# Patient Record
Sex: Female | Born: 2000 | Race: Black or African American | Hispanic: No | Marital: Single | Smoking: Never smoker
Health system: Southern US, Community
[De-identification: ages and names within clinical notes are randomized; demographics above are authoritative.]

## PROBLEM LIST (undated history)

## (undated) DIAGNOSIS — Z789 Other specified health status: Secondary | ICD-10-CM

## (undated) HISTORY — PX: NO PAST SURGERIES: SHX2092

---
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
## 2021-01-20 ENCOUNTER — Ambulatory Visit: Payer: Self-pay | Admitting: Family Medicine

## 2021-02-16 ENCOUNTER — Ambulatory Visit: Payer: Medicaid Other | Admitting: Family Medicine

## 2021-03-03 ENCOUNTER — Ambulatory Visit (INDEPENDENT_AMBULATORY_CARE_PROVIDER_SITE_OTHER): Payer: Medicaid Other | Admitting: Family Medicine

## 2021-03-03 ENCOUNTER — Other Ambulatory Visit: Payer: Self-pay

## 2021-03-03 ENCOUNTER — Encounter: Payer: Self-pay | Admitting: Family Medicine

## 2021-03-03 DIAGNOSIS — F411 Generalized anxiety disorder: Secondary | ICD-10-CM

## 2021-03-03 DIAGNOSIS — Z7689 Persons encountering health services in other specified circumstances: Secondary | ICD-10-CM

## 2021-03-04 ENCOUNTER — Telehealth: Payer: Self-pay | Admitting: Family Medicine

## 2021-03-04 NOTE — Telephone Encounter (Signed)
Patient mom called and said the apartment complex did not have a form to fill out concerning emotional support. However, they will take a standard letter from provider.

## 2021-03-04 NOTE — Telephone Encounter (Signed)
Patients mom called in asking if Dr. Andrey Campanile can write a letter stating the patient needs an emotional support animal and fax it Glenard Haring at [phone].

## 2021-03-04 NOTE — Progress Notes (Signed)
   New Patient Office Visit  Subjective:  Patient ID: Ashley Christian, female    DOB: 2001/02/09  Age: 20 y.o. MRN: [id number]  CC:  Chief Complaint  Patient presents with   Establish Care    HPI Ashley Christian presents for to establish care.  Patient is a Consulting civil engineer at [employer] reports that she has been having high anxiety related to her schoolwork as well as her living situation.  She was requesting to be able to have an emotional support animal to live with her.  History reviewed. No pertinent past medical history.  History reviewed. No pertinent surgical history.  Family History  Problem Relation Age of Onset   Graves' disease Mother    Alopecia Mother    Hyperthyroidism Mother    Hypertension Mother    Breast cancer Maternal Aunt    Hypertension Maternal Aunt    Thyroid disease Maternal Aunt    Hypertension Maternal Uncle    Hypertension Maternal Grandmother    Dementia Maternal Grandmother     Social History   Socioeconomic History   Marital status: Single    Spouse name: Not on file   Number of children: Not on file   Years of education: Not on file   Highest education level: Not on file  Occupational History   Not on file  Tobacco Use   Smoking status: Never   Smokeless tobacco: Never  Substance and Sexual Activity   Alcohol use: Not on file   Drug use: Not on file   Sexual activity: Not on file  Other Topics Concern   Not on file  Social History Narrative   Not on file   Social Determinants of Health   Financial Resource Strain: Not on file  Food Insecurity: Not on file  Transportation Needs: Not on file  Physical Activity: Not on file  Stress: Not on file  Social Connections: Not on file  Intimate Partner Violence: Not on file    ROS Review of Systems  Psychiatric/Behavioral:  Negative for self-injury, sleep disturbance and suicidal ideas. The patient is nervous/anxious.   All other systems reviewed and are negative.  Objective:   Today's  Vitals: There were no vitals taken for this visit.  Physical Exam Vitals and nursing note reviewed.  Constitutional:      General: She is not in acute distress. Cardiovascular:     Rate and Rhythm: Normal rate and regular rhythm.  Pulmonary:     Effort: Pulmonary effort is normal.     Breath sounds: Normal breath sounds.  Neurological:     General: No focal deficit present.     Mental Status: She is alert and oriented to person, place, and time.  Psychiatric:        Mood and Affect: Mood is anxious.        Behavior: Behavior normal.    Assessment & Plan:   1. Generalized anxiety disorder Patient defers any medications on today.  She will follow-up to see about counseling at the [facility].  She also will check with her housing for paperwork regarding emotional support animal.  2. Encounter to establish care     Outpatient Encounter Medications as of 03/03/2021  Medication Sig   levocetirizine (XYZAL) 5 MG tablet Take 5 mg by mouth daily.   No facility-administered encounter medications on file as of 03/03/2021.    Follow-up: Return if symptoms worsen or fail to improve.   Tommie Raymond, MD

## 2021-03-07 ENCOUNTER — Encounter: Payer: Self-pay | Admitting: Family Medicine

## 2021-05-27 ENCOUNTER — Encounter: Payer: Self-pay | Admitting: Nurse Practitioner

## 2021-05-27 ENCOUNTER — Telehealth (INDEPENDENT_AMBULATORY_CARE_PROVIDER_SITE_OTHER): Payer: Medicaid Other | Admitting: Nurse Practitioner

## 2021-05-27 ENCOUNTER — Other Ambulatory Visit: Payer: Self-pay

## 2021-05-27 DIAGNOSIS — J029 Acute pharyngitis, unspecified: Secondary | ICD-10-CM

## 2021-05-27 DIAGNOSIS — R6889 Other general symptoms and signs: Secondary | ICD-10-CM | POA: Diagnosis not present

## 2021-05-27 MED ORDER — AMOXICILLIN 875 MG PO TABS: 875.0000 mg | ORAL_TABLET | Freq: Two times a day (BID) | ORAL | 0 refills | Status: AC

## 2021-05-27 NOTE — Patient Instructions (Addendum)
URI:   Stay well hydrated  Stay active  Deep breathing exercises  May take tylenol for fever or pain  Will order amoxicillin  Warm salt water gargles recommended  Will order respiratory panel  Will order strep swab   Follow up:  Follow up if needed

## 2021-05-27 NOTE — Progress Notes (Signed)
Virtual Visit via Telephone Note  I connected with Christinna Petrosino on 05/27/21 at 10:20 AM EST by telephone and verified that I am speaking with the correct person using two identifiers.  Location: Patient: home Provider: office   I discussed the limitations, risks, security and privacy concerns of performing an evaluation and management service by telephone and the availability of in person appointments. I also discussed with the patient that there may be a patient responsible charge related to this service. The patient expressed understanding and agreed to proceed.   History of Present Illness:  Patient presents today for sick visit through telephone visit.  She states that she has been experiencing sore throat, headache, sinus pressure, trouble sleeping for the past 2 to 3 days.  She states that she thinks she had a fever yesterday but did not check her temperature. Denies f/c/s, n/v/d, hemoptysis, PND, chest pain or edema.     Observations/Objective:   Alert and oriented   Assessment and Plan:   URI:   Stay well hydrated  Stay active  Deep breathing exercises  May take tylenol for fever or pain  Will order amoxicillin  Warm salt water gargles recommended  Will order respiratory panel  Will order strep swab   Follow up:  Follow up if needed      I discussed the assessment and treatment plan with the patient. The patient was provided an opportunity to ask questions and all were answered. The patient agreed with the plan and demonstrated an understanding of the instructions.   The patient was advised to call back or seek an in-person evaluation if the symptoms worsen or if the condition fails to improve as anticipated.  I provided 23 minutes of non-face-to-face time during this encounter.   Ivonne Andrew, NP

## 2021-05-28 LAB — COVID-19, FLU A+B AND RSV
Influenza A, NAA: NOT DETECTED
Influenza B, NAA: NOT DETECTED
RSV, NAA: NOT DETECTED
SARS-CoV-2, NAA: NOT DETECTED

## 2021-05-29 ENCOUNTER — Emergency Department (HOSPITAL_COMMUNITY): Payer: Medicaid Other

## 2021-05-29 ENCOUNTER — Other Ambulatory Visit: Payer: Self-pay

## 2021-05-29 ENCOUNTER — Encounter (HOSPITAL_COMMUNITY): Payer: Self-pay

## 2021-05-29 ENCOUNTER — Emergency Department (HOSPITAL_COMMUNITY)
Admission: EM | Admit: 2021-05-29 | Discharge: 2021-05-29 | Disposition: A | Discharge status: Home / Self Care | Payer: Medicaid Other | Attending: Emergency Medicine | Admitting: Emergency Medicine

## 2021-05-29 DIAGNOSIS — J039 Acute tonsillitis, unspecified: Secondary | ICD-10-CM | POA: Insufficient documentation

## 2021-05-29 DIAGNOSIS — J351 Hypertrophy of tonsils: Secondary | ICD-10-CM | POA: Diagnosis not present

## 2021-05-29 DIAGNOSIS — J029 Acute pharyngitis, unspecified: Secondary | ICD-10-CM | POA: Diagnosis not present

## 2021-05-29 DIAGNOSIS — Z20822 Contact with and (suspected) exposure to covid-19: Secondary | ICD-10-CM | POA: Insufficient documentation

## 2021-05-29 DIAGNOSIS — R590 Localized enlarged lymph nodes: Secondary | ICD-10-CM | POA: Diagnosis not present

## 2021-05-29 LAB — RESP PANEL BY RT-PCR (FLU A&B, COVID) ARPGX2
Influenza A by PCR: NEGATIVE
Influenza B by PCR: NEGATIVE
SARS Coronavirus 2 by RT PCR: NEGATIVE

## 2021-05-29 LAB — CBC WITH DIFFERENTIAL/PLATELET
Abs Immature Granulocytes: 0.03 10*3/uL (ref 0.00–0.07)
Basophils Absolute: 0.1 10*3/uL (ref 0.0–0.1)
Basophils Relative: 1 %
Eosinophils Absolute: 0.1 10*3/uL (ref 0.0–0.5)
Eosinophils Relative: 1 %
HCT: 37.9 % (ref 36.0–46.0)
Hemoglobin: 12.9 g/dL (ref 12.0–15.0)
Immature Granulocytes: 0 %
Lymphocytes Relative: 50 %
Lymphs Abs: 3.3 10*3/uL (ref 0.7–4.0)
MCH: 29.4 pg (ref 26.0–34.0)
MCHC: 34 g/dL (ref 30.0–36.0)
MCV: 86.3 fL (ref 80.0–100.0)
Monocytes Absolute: 0.8 10*3/uL (ref 0.1–1.0)
Monocytes Relative: 12 %
Neutro Abs: 2.5 10*3/uL (ref 1.7–7.7)
Neutrophils Relative %: 36 %
Platelets: 173 10*3/uL (ref 150–400)
RBC: 4.39 MIL/uL (ref 3.87–5.11)
RDW: 12.3 % (ref 11.5–15.5)
WBC Morphology: ABNORMAL
WBC: 6.8 10*3/uL (ref 4.0–10.5)
nRBC: 0 % (ref 0.0–0.2)

## 2021-05-29 LAB — BASIC METABOLIC PANEL
Anion gap: 12 (ref 5–15)
BUN: 18 mg/dL (ref 6–20)
CO2: 25 mmol/L (ref 22–32)
Calcium: 9.4 mg/dL (ref 8.9–10.3)
Chloride: 96 mmol/L — ABNORMAL LOW (ref 98–111)
Creatinine, Ser: 0.85 mg/dL (ref 0.44–1.00)
GFR, Estimated: >60 mL/min (ref >60)
Glucose, Bld: 90 mg/dL (ref 70–99)
Potassium: 3.6 mmol/L (ref 3.5–5.1)
Sodium: 133 mmol/L — ABNORMAL LOW (ref 135–145)

## 2021-05-29 LAB — GROUP A STREP BY PCR: Group A Strep by PCR: NOT DETECTED

## 2021-05-29 LAB — I-STAT BETA HCG BLOOD, ED (MC, WL, AP ONLY): I-stat hCG, quantitative: <5 m[IU]/mL (ref <5)

## 2021-05-29 MED ORDER — ACETAMINOPHEN 325 MG PO TABS: 650.0000 mg | ORAL_TABLET | Freq: Once | ORAL | Status: DC | PRN

## 2021-05-29 MED ORDER — HYDROCODONE-ACETAMINOPHEN 7.5-325 MG/15ML PO SOLN
10.0000 mL | Freq: Once | ORAL | Status: AC
  Administered 2021-05-29: 10 mL via ORAL
  Filled 2021-05-29: qty 15

## 2021-05-29 MED ORDER — HYDROCODONE-ACETAMINOPHEN 7.5-325 MG/15ML PO SOLN: 10.0000 mL | ORAL | 0 refills | Status: DC | PRN

## 2021-05-29 MED ORDER — DEXAMETHASONE SODIUM PHOSPHATE 10 MG/ML IJ SOLN
10.0000 mg | Freq: Once | INTRAMUSCULAR | Status: AC
  Administered 2021-05-29: 22:00:00 10 mg via INTRAVENOUS
  Filled 2021-05-29: qty 1

## 2021-05-29 MED ORDER — CLINDAMYCIN PHOSPHATE 600 MG/50ML IV SOLN
600.0000 mg | Freq: Once | INTRAVENOUS | Status: AC
  Administered 2021-05-29: 22:00:00 600 mg via INTRAVENOUS
  Filled 2021-05-29: qty 50

## 2021-05-29 MED ORDER — IOHEXOL 350 MG/ML SOLN
60.0000 mL | Freq: Once | INTRAVENOUS | Status: AC | PRN
  Administered 2021-05-29: 21:00:00 60 mL via INTRAVENOUS

## 2021-05-29 NOTE — Discharge Instructions (Signed)
Follow up with ENT (Dr. Jearld Fenton) for outpatient evaluation of severe tonsillitis. Hycet for pain as needed.   Return to the ED with any new or concerning symptoms at any time.

## 2021-05-29 NOTE — ED Provider Notes (Signed)
[facility]-EMERGENCY DEPT Provider Note   CSN: [id number] Arrival date & time: 05/29/21  1849     History Chief Complaint  Patient presents with   Sore Throat    Ashley Christian is a 20 y.o. female.  Patient to ED for evaluation of severe sore throat that started several days ago. She notes her tonsils are swollen with a white growth bilaterally. She has been on Amoxicillin for the past 4 days and reports symptoms are getting worse. She is able to drink liquids but is drinking less secondary to pain. She does not want to eat. No vomiting or diarrhea. No fever.   The history is provided by the patient. No language interpreter was used.  Sore Throat Pertinent negatives include no shortness of breath.      History reviewed. No pertinent past medical history.  There are no problems to display for this patient.   History reviewed. No pertinent surgical history.   OB History   No obstetric history on file.     Family History  Problem Relation Age of Onset   Graves' disease Mother    Alopecia Mother    Hyperthyroidism Mother    Hypertension Mother    Breast cancer Maternal Aunt    Hypertension Maternal Aunt    Thyroid disease Maternal Aunt    Hypertension Maternal Uncle    Hypertension Maternal Grandmother    Dementia Maternal Grandmother     Social History   Tobacco Use   Smoking status: Never   Smokeless tobacco: Never    Home Medications Prior to Admission medications   Medication Sig Start Date End Date Taking? Authorizing Provider  amoxicillin (AMOXIL) 875 MG tablet Take 1 tablet (875 mg total) by mouth 2 (two) times daily for 10 days. 05/27/21 06/06/21  Ivonne Andrew, NP  levocetirizine (XYZAL) 5 MG tablet Take 5 mg by mouth daily. 03/03/21   [provider]    Allergies    Patient has no known allergies.  Review of Systems   Review of Systems  Constitutional:  Positive for appetite change. Negative for chills and  fever.  HENT:  Positive for sore throat. Negative for congestion.   Respiratory: Negative.  Negative for cough and shortness of breath.   Cardiovascular: Negative.   Gastrointestinal: Negative.  Negative for nausea and vomiting.  Musculoskeletal: Negative.   Skin: Negative.  Negative for rash.  Neurological: Negative.  Negative for weakness.   Physical Exam Updated Vital Signs BP (!) 110/92 (BP Location: Right Arm)    Pulse 82    Temp 98.4 F (36.9 C) (Oral)    Resp 16    Ht 5\' 8"  (1.727 m)    Wt 54.4 kg    LMP 05/29/2021    SpO2 100%    BMI 18.25 kg/m   Physical Exam Vitals and nursing note reviewed.  Constitutional:      Appearance: She is well-developed.  HENT:     Head: Normocephalic.     Mouth/Throat:     Pharynx: Uvula midline. No uvula swelling.     Tonsils: Tonsillar exudate present. 2+ on the right. 2+ on the left.  Cardiovascular:     Rate and Rhythm: Normal rate and regular rhythm.     Heart sounds: No murmur heard. Pulmonary:     Effort: Pulmonary effort is normal.     Breath sounds: Normal breath sounds. No wheezing, rhonchi or rales.  Abdominal:     General: Bowel sounds are normal.  Palpations: Abdomen is soft.     Tenderness: There is no abdominal tenderness. There is no guarding or rebound.  Musculoskeletal:        General: Normal range of motion.     Cervical back: Normal range of motion and neck supple.  Skin:    General: Skin is warm and dry.  Neurological:     General: No focal deficit present.     Mental Status: She is alert and oriented to person, place, and time.    ED Results / Procedures / Treatments   Labs (all labs ordered are listed, but only abnormal results are displayed) Labs Reviewed  BASIC METABOLIC PANEL - Abnormal; Notable for the following components:      Result Value   Sodium 133 (*)    Chloride 96 (*)    All other components within normal limits  GROUP A STREP BY PCR  RESP PANEL BY RT-PCR (FLU A&B, COVID) ARPGX2  CBC  WITH DIFFERENTIAL/PLATELET  I-STAT BETA HCG BLOOD, ED ([facility], [facility], [facility] ONLY)   Results for orders placed or performed during the hospital encounter of 05/29/21  Group A Strep by PCR   Specimen: Throat; Sterile Swab  Result Value Ref Range   Group A Strep by PCR NOT DETECTED NOT DETECTED  Resp Panel by RT-PCR (Flu A&B, Covid) Throat   Specimen: Throat; Nasopharyngeal(NP) swabs in vial transport medium  Result Value Ref Range   SARS Coronavirus 2 by RT PCR NEGATIVE NEGATIVE   Influenza A by PCR NEGATIVE NEGATIVE   Influenza B by PCR NEGATIVE NEGATIVE  CBC with Differential  Result Value Ref Range   WBC 6.8 4.0 - 10.5 K/uL   RBC 4.39 3.87 - 5.11 MIL/uL   Hemoglobin 12.9 12.0 - 15.0 g/dL   HCT 37.9 36.0 - 46.0 %   MCV 86.3 80.0 - 100.0 fL   MCH 29.4 26.0 - 34.0 pg   MCHC 34.0 30.0 - 36.0 g/dL   RDW 12.3 11.5 - 15.5 %   Platelets 173 150 - 400 K/uL   nRBC 0.0 0.0 - 0.2 %   Neutrophils Relative % 36 %   Neutro Abs 2.5 1.7 - 7.7 K/uL   Lymphocytes Relative 50 %   Lymphs Abs 3.3 0.7 - 4.0 K/uL   Monocytes Relative 12 %   Monocytes Absolute 0.8 0.1 - 1.0 K/uL   Eosinophils Relative 1 %   Eosinophils Absolute 0.1 0.0 - 0.5 K/uL   Basophils Relative 1 %   Basophils Absolute 0.1 0.0 - 0.1 K/uL   WBC Morphology Abnormal lymphocytes present    RBC Morphology MORPHOLOGY UNREMARKABLE    Smear Review MORPHOLOGY UNREMARKABLE    Immature Granulocytes 0 %   Abs Immature Granulocytes 0.03 0.00 - 0.07 K/uL  Basic metabolic panel  Result Value Ref Range   Sodium 133 (L) 135 - 145 mmol/L   Potassium 3.6 3.5 - 5.1 mmol/L   Chloride 96 (L) 98 - 111 mmol/L   CO2 25 22 - 32 mmol/L   Glucose, Bld 90 70 - 99 mg/dL   BUN 18 6 - 20 mg/dL   Creatinine, Ser 0.85 0.44 - 1.00 mg/dL   Calcium 9.4 8.9 - 10.3 mg/dL   GFR, Estimated >60 >60 mL/min   Anion gap 12 5 - 15  I-Stat beta hCG blood, ED  Result Value Ref Range   I-stat hCG, quantitative <5.0 <5 mIU/mL   Comment 3  EKG None  Radiology CT Soft Tissue Neck W Contrast  Result Date: 05/29/2021 CLINICAL DATA:  Sore throat since Thursday EXAM: CT NECK WITH CONTRAST TECHNIQUE: Multidetector CT imaging of the neck was performed using the standard protocol following the bolus administration of intravenous contrast. CONTRAST:  32mL OMNIPAQUE IOHEXOL 350 MG/ML SOLN COMPARISON:  None. FINDINGS: Pharynx and larynx: Enlargement and heterogeneous enhancement of the bilateral palatine tonsils, which enhance heterogeneously, and meet in the midline ("kissing tonsils"), concerning for tonsillitis. No definite low-density collection is seen to suggest peritonsillar abscess. The larynx and pharynx are otherwise unremarkable. Salivary glands: No inflammation, mass, or stone. Thyroid: Normal. Lymph nodes: Prominent bilateral level 2A lymph nodes, the largest of which is on the right, measuring up to 1.3 cm. This is favored to be reactive. Vascular: Patent arterial and venous vasculature. Limited intracranial: Negative. Visualized orbits: Negative. Mastoids and visualized paranasal sinuses: Clear. Skeleton: No acute osseous abnormality. Upper chest: Negative. Other: None IMPRESSION: Enlargement and heterogeneous enhancement of the bilateral palatine tonsils, concerning for tonsillitis. No definite low-density collection to suggest peritonsillar abscess. Electronically Signed   By: Merilyn Baba M.D.   On: 05/29/2021 22:19    Procedures Procedures   Medications Ordered in ED Medications  acetaminophen (TYLENOL) tablet 650 mg (has no administration in time range)  clindamycin (CLEOCIN) IVPB 600 mg (600 mg Intravenous New Bag/Given 05/29/21 2202)  dexamethasone (DECADRON) injection 10 mg (10 mg Intravenous Given 05/29/21 2202)  iohexol (OMNIPAQUE) 350 MG/ML injection 60 mL (60 mLs Intravenous Contrast Given 05/29/21 2122)    ED Course  I have reviewed the triage vital signs and the nursing notes.  Pertinent labs & imaging  results that were available during my care of the patient were reviewed by me and considered in my medical decision making (see chart for details).    MDM Rules/Calculators/A&P                          Patient to ED with tonsillitis, no better on Amoxil for 3-4 days, no fever.   Labs ordered during the triage process reviewed and are reassuring. CT soft tissue neck c/w tonsillitis. Decadron given for inflammation and swelling. IVF's provided after which she feels some better. Feel she can be discharged home with ENT referral. Will provide Hycet for comfort care, encouraged continuation of Tylenol and ibuprofen, fluids.   Return precautions discussed.     Final Clinical Impression(s) / ED Diagnoses Final diagnoses:  None   Exudatitive tonsillitis  Rx / DC Orders ED Discharge Orders     None        Charlann Lange, PA-C 05/30/21 [id number]    Drenda Freeze, MD 05/30/21 [id number]

## 2021-05-29 NOTE — ED Provider Notes (Signed)
Emergency Medicine Provider Triage Evaluation Note  Ashley Christian , a 20 y.o. female  was evaluated in triage.  Pt complains of sore throat. Seen at urgent care earlier this week and started on amox, sxs worse.  Review of Systems  Positive: Sore throat Negative: cough  Physical Exam  BP 113/87 (BP Location: Right Arm)    Pulse 93    Temp 98.4 F (36.9 C) (Oral)    Resp 16    Ht 5\' 8"  (1.727 m)    Wt 54.4 kg    SpO2 96%    BMI 18.25 kg/m  Gen:   Awake, no distress   Resp:  Normal effort  MSK:   Moves extremities without difficulty  Other:  Exudates noted bilaterally to the tonsils, tonsils appear to be touching, voice is muffled but pt tolerating secretions  Medical Decision Making  Medically screening exam initiated at 7:52 PM.  Appropriate orders placed.  Ashley Christian was informed that the remainder of the evaluation will be completed by another provider, this initial triage assessment does not replace that evaluation, and the importance of remaining in the ED until their evaluation is complete.    05/29/21 1953    05/31/21, MD 05/29/21 2029

## 2021-05-29 NOTE — ED Triage Notes (Signed)
Pt reports with sore throat since Thursday. Pt has spots and odor coming from her throat.

## 2021-05-30 ENCOUNTER — Telehealth: Payer: Self-pay

## 2021-05-30 LAB — CULTURE, GROUP A STREP: Strep A Culture: NEGATIVE

## 2021-05-30 NOTE — Telephone Encounter (Signed)
Transition Care Management Unsuccessful Follow-up Telephone Call  Date of discharge and from where:  05/29/2021-   Attempts:  1st Attempt  Reason for unsuccessful TCM follow-up call:  Unable to leave message

## 2021-05-31 NOTE — Telephone Encounter (Signed)
Transition Care Management Unsuccessful Follow-up Telephone Call  Date of discharge and from where:  05/29/2021-[facility]   Attempts:  2nd Attempt  Reason for unsuccessful TCM follow-up call:  Unable to leave message

## 2021-06-01 NOTE — Telephone Encounter (Signed)
Transition Care Management Follow-up Telephone Call Date of discharge and from where: 05/29/2021 from [facility] How have you been since you were released from the hospital? Pt stated that she is feeling better and the pain she was experiencing is better.  Any questions or concerns? No  Items Reviewed: Did the pt receive and understand the discharge instructions provided? Yes  Medications obtained and verified? Yes  Other? No  Any new allergies since your discharge? No  Dietary orders reviewed? No Do you have support at home? Yes   Functional Questionnaire: (I = Independent and D = Dependent) ADLs: I  Bathing/Dressing- I  Meal Prep- I  Eating- I  Maintaining continence- I  Transferring/Ambulation- I  Managing Meds- I   Follow up appointments reviewed:  PCP Hospital f/u appt confirmed? No   Specialist Hospital f/u appt confirmed? Yes  Pt stated that mother has scheduled with ENT.  Are transportation arrangements needed? No  If their condition worsens, is the pt aware to call PCP or go to the Emergency Dept.? Yes Was the patient provided with contact information for the PCP's office or ED? Yes Was to pt encouraged to call back with questions or concerns? Yes

## 2021-06-02 DIAGNOSIS — J039 Acute tonsillitis, unspecified: Secondary | ICD-10-CM | POA: Diagnosis not present

## 2021-11-16 DIAGNOSIS — Z01419 Encounter for gynecological examination (general) (routine) without abnormal findings: Secondary | ICD-10-CM | POA: Diagnosis not present

## 2021-11-16 DIAGNOSIS — Z1389 Encounter for screening for other disorder: Secondary | ICD-10-CM | POA: Diagnosis not present

## 2021-11-16 DIAGNOSIS — Z3040 Encounter for surveillance of contraceptives, unspecified: Secondary | ICD-10-CM | POA: Diagnosis not present

## 2021-11-16 DIAGNOSIS — Z30013 Encounter for initial prescription of injectable contraceptive: Secondary | ICD-10-CM | POA: Diagnosis not present

## 2021-11-16 DIAGNOSIS — Z113 Encounter for screening for infections with a predominantly sexual mode of transmission: Secondary | ICD-10-CM | POA: Diagnosis not present

## 2021-11-16 DIAGNOSIS — Z130 Encounter for screening for diseases of the blood and blood-forming organs and certain disorders involving the immune mechanism: Secondary | ICD-10-CM | POA: Diagnosis not present

## 2021-11-22 DIAGNOSIS — A549 Gonococcal infection, unspecified: Secondary | ICD-10-CM | POA: Diagnosis not present

## 2021-12-01 ENCOUNTER — Ambulatory Visit
Admission: EM | Admit: 2021-12-01 | Discharge: 2021-12-01 | Disposition: A | Discharge status: Home / Self Care | Payer: Medicaid Other | Attending: Internal Medicine | Admitting: Internal Medicine

## 2021-12-01 ENCOUNTER — Ambulatory Visit: Payer: Self-pay | Admitting: *Deleted

## 2021-12-01 DIAGNOSIS — R1013 Epigastric pain: Secondary | ICD-10-CM

## 2021-12-01 MED ORDER — OMEPRAZOLE 20 MG PO CPDR
20.0000 mg | DELAYED_RELEASE_CAPSULE | Freq: Every day | ORAL | 0 refills | Status: AC
Start: 2021-12-01 — End: ?

## 2021-12-01 MED ORDER — SUCRALFATE 1 G PO TABS: 1.0000 g | ORAL_TABLET | Freq: Three times a day (TID) | ORAL | 0 refills | Status: DC

## 2021-12-01 NOTE — Telephone Encounter (Signed)
Reason for Disposition  [1] Swallowing difficulty AND [2] cause unknown (Exception: difficulty swallowing is a chronic symptom)  Answer Assessment - Initial Assessment Questions 1. SYMPTOM: "Are you having difficulty swallowing liquids, solids, or both?"     Painful swallowing- feels pain further down- chest/stomach 2. ONSET: "When did the swallowing problems begin?"      1 week 3. CAUSE: "What do you think is causing the problem?"      unsure 4. CHRONIC/RECURRENT: "Is this a new problem for you?"  If no, ask: "How long have you had this problem?" (e.g., days, weeks, months)      New problem- 1 week 5. OTHER SYMPTOMS: "Do you have any other symptoms?" (e.g., difficulty breathing, sore throat, swollen tongue, chest pain)     Pain in chest- almost like swallowing too much at one time 6. PREGNANCY: "Is there any chance you are pregnant?" "When was your last menstrual period?"     No- LMP-last month  Protocols used: Swallowing Difficulty-A-AH

## 2021-12-01 NOTE — ED Provider Notes (Signed)
[facility]    CSN: [id number] Arrival date & time: 12/01/21  1714      History   Chief Complaint Chief Complaint  Patient presents with   Abdominal Pain    HPI Ashley Christian is a 21 y.o. female.   Patient presents with epigastric pain that started a few days prior.  Patient reports that symptoms are exacerbated with eating or drinking.  She describes it as a burning pain that starts in the epigastric area and sometimes radiates up her chest into her neck.  Patient does not take any medications for symptoms.  Denies history of acid reflux.  Denies nausea, vomiting, diarrhea, blood in stool.   Abdominal Pain   History reviewed. No pertinent past medical history.  There are no problems to display for this patient.   History reviewed. No pertinent surgical history.  OB History   No obstetric history on file.      Home Medications    Prior to Admission medications   Medication Sig Start Date End Date Taking? Authorizing Provider  omeprazole (PRILOSEC) 20 MG capsule Take 1 capsule (20 mg total) by mouth daily. 12/01/21  Yes , Acie Fredrickson, FNP  sucralfate (CARAFATE) 1 g tablet Take 1 tablet (1 g total) by mouth 4 (four) times daily -  with meals and at bedtime. 12/01/21  Yes , Acie Fredrickson, FNP  HYDROcodone-acetaminophen (HYCET) 7.5-325 mg/15 ml solution Take 10 mLs by mouth every 4 (four) hours as needed for moderate pain or severe pain. 05/29/21   Elpidio Anis, PA-C  levocetirizine (XYZAL) 5 MG tablet Take 5 mg by mouth daily. 03/03/21   [provider]    Family History Family History  Problem Relation Age of Onset   Graves' disease Mother    Alopecia Mother    Hyperthyroidism Mother    Hypertension Mother    Breast cancer Maternal Aunt    Hypertension Maternal Aunt    Thyroid disease Maternal Aunt    Hypertension Maternal Uncle    Hypertension Maternal Grandmother    Dementia Maternal Grandmother     Social History Social History    Tobacco Use   Smoking status: Never   Smokeless tobacco: Never     Allergies   Patient has no known allergies.   Review of Systems Review of Systems Per HPI  Physical Exam Triage Vital Signs ED Triage Vitals  Enc Vitals Group     BP 12/01/21 1733 112/72     Pulse Rate 12/01/21 1733 83     Resp --      Temp 12/01/21 1733 98.4 F (36.9 C)     Temp Source 12/01/21 1733 Oral     SpO2 12/01/21 1733 95 %     Weight --      Height --      Head Circumference --      Peak Flow --      Pain Score 12/01/21 1739 0     Pain Loc --      Pain Edu? --      Excl. in GC? --    No data found.  Updated Vital Signs BP 112/72 (BP Location: Left Arm)   Pulse 83   Temp 98.4 F (36.9 C) (Oral)   SpO2 95%   Visual Acuity Right Eye Distance:   Left Eye Distance:   Bilateral Distance:    Right Eye Near:   Left Eye Near:    Bilateral Near:     Physical Exam Constitutional:  General: She is not in acute distress.    Appearance: Normal appearance. She is not toxic-appearing or diaphoretic.  HENT:     Head: Normocephalic and atraumatic.     Mouth/Throat:     Mouth: Mucous membranes are moist.     Pharynx: No posterior oropharyngeal erythema.  Eyes:     Extraocular Movements: Extraocular movements intact.     Conjunctiva/sclera: Conjunctivae normal.  Cardiovascular:     Rate and Rhythm: Normal rate and regular rhythm.     Pulses: Normal pulses.     Heart sounds: Normal heart sounds.  Pulmonary:     Effort: Pulmonary effort is normal. No respiratory distress.     Breath sounds: Normal breath sounds.  Abdominal:     General: Abdomen is flat. Bowel sounds are normal.     Palpations: Abdomen is soft.     Tenderness: There is no abdominal tenderness.  Neurological:     General: No focal deficit present.     Mental Status: She is alert and oriented to person, place, and time. Mental status is at baseline.  Psychiatric:        Mood and Affect: Mood normal.         Behavior: Behavior normal.        Thought Content: Thought content normal.        Judgment: Judgment normal.      UC Treatments / Results  Labs (all labs ordered are listed, but only abnormal results are displayed) Labs Reviewed - No data to display  EKG   Radiology No results found.  Procedures Procedures (including critical care time)  Medications Ordered in UC Medications - No data to display  Initial Impression / Assessment and Plan / UC Course  I have reviewed the triage vital signs and the nursing notes.  Pertinent labs & imaging results that were available during my care of the patient were reviewed by me and considered in my medical decision making (see chart for details).     Symptoms and physical exam are consistent with possible GERD.  Will treat with PPI and Carafate.  Due to patient's age and new symptoms, I do think this warrants further evaluation by gastrointestinal specialist.  Patient was provided with contact information.  Patient was advised of supportive care as well.  Discussed return precautions.  Patient verbalized understanding and was agreeable with plan. Final Clinical Impressions(s) / UC Diagnoses   Final diagnoses:  Abdominal pain, epigastric     Discharge Instructions      I am suspicious that you are having reflux which is heartburn.  This is being treated with 2 different medications.  Please follow-up with provided contact information for stomach doctor tomorrow for further evaluation and management.    ED Prescriptions     Medication Sig Dispense Auth. Provider   omeprazole (PRILOSEC) 20 MG capsule Take 1 capsule (20 mg total) by mouth daily. 30 capsule Valle, Tyrone E, [place]   sucralfate (CARAFATE) 1 g tablet Take 1 tablet (1 g total) by mouth 4 (four) times daily -  with meals and at bedtime. 120 tablet Aldrich, Acie Fredrickson, [place]      PDMP not reviewed this encounter.   Gustavus Bryant, [place] 12/01/21 [phone]

## 2021-12-01 NOTE — ED Triage Notes (Signed)
Pt c/o pain in stomach that is burning and stabbing that happens when she eats or drinks something. States it's minor at first and then gets more intense. Onset several days ago.

## 2021-12-01 NOTE — Discharge Instructions (Signed)
I am suspicious that you are having reflux which is heartburn.  This is being treated with 2 different medications.  Please follow-up with provided contact information for stomach doctor tomorrow for further evaluation and management.

## 2021-12-20 ENCOUNTER — Encounter: Payer: Self-pay | Admitting: Family Medicine

## 2021-12-20 ENCOUNTER — Ambulatory Visit (INDEPENDENT_AMBULATORY_CARE_PROVIDER_SITE_OTHER): Payer: Medicaid Other | Admitting: Family Medicine

## 2021-12-20 VITALS — BP 107/69 | HR 98 | Temp 98.1°F | Resp 16 | Wt 127.0 lb

## 2021-12-20 DIAGNOSIS — J302 Other seasonal allergic rhinitis: Secondary | ICD-10-CM | POA: Diagnosis not present

## 2021-12-20 DIAGNOSIS — Z202 Contact with and (suspected) exposure to infections with a predominantly sexual mode of transmission: Secondary | ICD-10-CM | POA: Diagnosis not present

## 2021-12-20 DIAGNOSIS — F411 Generalized anxiety disorder: Secondary | ICD-10-CM

## 2021-12-20 MED ORDER — LEVOCETIRIZINE DIHYDROCHLORIDE 5 MG PO TABS: 5.0000 mg | ORAL_TABLET | Freq: Every day | ORAL | 3 refills | Status: AC

## 2021-12-20 NOTE — Progress Notes (Unsigned)
Patient is here for STI check. Patient said her prior partner was dx with herpes/  Patient would like a medication refill

## 2021-12-21 ENCOUNTER — Encounter: Payer: Self-pay | Admitting: Family Medicine

## 2021-12-21 NOTE — Progress Notes (Signed)
Established Patient Office Visit  Subjective    Patient ID: Ashley Christian, female    DOB: 2001-03-06  Age: 21 y.o. MRN: [id number]  CC:  Chief Complaint  Patient presents with   Medication Refill   Exposure to STD    HPI Ashley Christian presents for potential exposure to STD. Her present sexual partner reports that he has been dx with herpes in the past but denies acute symptoms. Patient denies any sx as well.    Outpatient Encounter Medications as of 12/20/2021  Medication Sig   medroxyPROGESTERone (DEPO-PROVERA) 150 MG/ML injection Inject 150 mg into the muscle every 3 (three) months.   omeprazole (PRILOSEC) 20 MG capsule Take 1 capsule (20 mg total) by mouth daily.   [DISCONTINUED] levocetirizine (XYZAL) 5 MG tablet Take 5 mg by mouth daily.   HYDROcodone-acetaminophen (HYCET) 7.5-325 mg/15 ml solution Take 10 mLs by mouth every 4 (four) hours as needed for moderate pain or severe pain. (Patient not taking: Reported on 12/20/2021)   levocetirizine (XYZAL) 5 MG tablet Take 1 tablet (5 mg total) by mouth daily.   [DISCONTINUED] sucralfate (CARAFATE) 1 g tablet Take 1 tablet (1 g total) by mouth 4 (four) times daily -  with meals and at bedtime.   No facility-administered encounter medications on file as of 12/20/2021.    No past medical history on file.  No past surgical history on file.  Family History  Problem Relation Age of Onset   Graves' disease Mother    Alopecia Mother    Hyperthyroidism Mother    Hypertension Mother    Breast cancer Maternal Aunt    Hypertension Maternal Aunt    Thyroid disease Maternal Aunt    Hypertension Maternal Uncle    Hypertension Maternal Grandmother    Dementia Maternal Grandmother     Social History   Socioeconomic History   Marital status: Single    Spouse name: Not on file   Number of children: Not on file   Years of education: Not on file   Highest education level: Not on file  Occupational History   Not on file  Tobacco  Use   Smoking status: Never   Smokeless tobacco: Never  Substance and Sexual Activity   Alcohol use: Not on file   Drug use: Not on file   Sexual activity: Not on file  Other Topics Concern   Not on file  Social History Narrative   Not on file   Social Determinants of Health   Financial Resource Strain: Not on file  Food Insecurity: Not on file  Transportation Needs: Not on file  Physical Activity: Not on file  Stress: Not on file  Social Connections: Not on file  Intimate Partner Violence: Not on file    Review of Systems  All other systems reviewed and are negative.       Objective    BP 107/69   Pulse 98   Temp 98.1 F (36.7 C) (Oral)   Resp 16   Wt 127 lb (57.6 kg)   SpO2 99%   BMI 19.31 kg/m   Physical Exam Vitals and nursing note reviewed.  Constitutional:      General: She is not in acute distress. Cardiovascular:     Rate and Rhythm: Normal rate and regular rhythm.  Pulmonary:     Effort: Pulmonary effort is normal.     Breath sounds: Normal breath sounds.  Abdominal:     Palpations: Abdomen is soft.     Tenderness:  There is no abdominal tenderness.  Genitourinary:    Comments: Deferred Neurological:     General: No focal deficit present.     Mental Status: She is alert and oriented to person, place, and time.  Psychiatric:        Mood and Affect: Affect normal. Mood is anxious.         Assessment & Plan:   1. Seasonal allergies Xyzal refilled  2. Potential exposure to STD Patient defers any testing at this time as she has no symptoms. Discussed barrier methods/condoms.  3. Generalized anxiety disorder ? 2/2 above   No follow-ups on file.   Tommie Raymond, MD

## 2022-03-08 DIAGNOSIS — Z3A20 20 weeks gestation of pregnancy: Secondary | ICD-10-CM | POA: Diagnosis not present

## 2022-03-08 DIAGNOSIS — Z3201 Encounter for pregnancy test, result positive: Secondary | ICD-10-CM | POA: Diagnosis not present

## 2022-03-08 DIAGNOSIS — O3680X0 Pregnancy with inconclusive fetal viability, not applicable or unspecified: Secondary | ICD-10-CM | POA: Diagnosis not present

## 2022-03-13 DIAGNOSIS — Z113 Encounter for screening for infections with a predominantly sexual mode of transmission: Secondary | ICD-10-CM | POA: Diagnosis not present

## 2022-03-13 DIAGNOSIS — Z3689 Encounter for other specified antenatal screening: Secondary | ICD-10-CM | POA: Diagnosis not present

## 2022-03-13 DIAGNOSIS — Z3402 Encounter for supervision of normal first pregnancy, second trimester: Secondary | ICD-10-CM | POA: Diagnosis not present

## 2022-03-13 DIAGNOSIS — Z368A Encounter for antenatal screening for other genetic defects: Secondary | ICD-10-CM | POA: Diagnosis not present

## 2022-03-13 LAB — OB RESULTS CONSOLE HEPATITIS B SURFACE ANTIGEN: Hepatitis B Surface Ag: NEGATIVE

## 2022-03-13 LAB — OB RESULTS CONSOLE HIV ANTIBODY (ROUTINE TESTING): HIV: NONREACTIVE

## 2022-03-13 LAB — OB RESULTS CONSOLE RPR: RPR: NONREACTIVE

## 2022-03-13 LAB — OB RESULTS CONSOLE GC/CHLAMYDIA
Chlamydia: NEGATIVE
Neisseria Gonorrhea: NEGATIVE

## 2022-03-13 LAB — HEPATITIS C ANTIBODY: HCV Ab: NEGATIVE

## 2022-03-13 LAB — OB RESULTS CONSOLE RUBELLA ANTIBODY, IGM: Rubella: IMMUNE

## 2022-03-14 DIAGNOSIS — Z3689 Encounter for other specified antenatal screening: Secondary | ICD-10-CM | POA: Diagnosis not present

## 2022-04-25 DIAGNOSIS — Z3A26 26 weeks gestation of pregnancy: Secondary | ICD-10-CM | POA: Diagnosis not present

## 2022-04-25 DIAGNOSIS — O0932 Supervision of pregnancy with insufficient antenatal care, second trimester: Secondary | ICD-10-CM | POA: Diagnosis not present

## 2022-05-11 DIAGNOSIS — Z23 Encounter for immunization: Secondary | ICD-10-CM | POA: Diagnosis not present

## 2022-05-11 DIAGNOSIS — O0933 Supervision of pregnancy with insufficient antenatal care, third trimester: Secondary | ICD-10-CM | POA: Diagnosis not present

## 2022-05-11 DIAGNOSIS — Z8619 Personal history of other infectious and parasitic diseases: Secondary | ICD-10-CM | POA: Diagnosis not present

## 2022-05-11 DIAGNOSIS — Z3A28 28 weeks gestation of pregnancy: Secondary | ICD-10-CM | POA: Diagnosis not present

## 2022-05-11 DIAGNOSIS — Z3689 Encounter for other specified antenatal screening: Secondary | ICD-10-CM | POA: Diagnosis not present

## 2022-06-12 NOTE — L&D Delivery Note (Signed)
Delivery Note Ashley Christian is a G1P0 at 66w4dwho had a spontaneous delivery at 0403 a viable female ([place]) was delivered via  occiput posterior.  APGAR: 9, 9; weight 2610g (5lb 12.1oz) .     Admitted for active labor. Had spontaneous rupture of membranes. Called by RN due to deep variable decelerations into 50-60s. I checked patient at 0354, fully dilated + 3 and pushing involuntarily. Pushed for 9 minutes. Baby was delivered without difficulty. Tight nuchal cord x 2.  Delayed cord clamping for 60 seconds.  Delivery of placenta was spontaneous. Placenta was found to be intact, 3 -vessel cord was noted. The fundus was found to be firm. 2nd degree perineal laceration was repaired in the normal sterile fashion with 2-0 and 3-0 vicryl after infiltration with 1% lidocaine. Persistent oozing from the vaginal bed was controlled with pressure. TXA 1g was given.  Estimated blood loss 450cc. Instrument and gauze counts were correct at the end of the procedure.   Placenta status: to L&D  Anesthesia:  lidocaine for repair Episiotomy:  none Lacerations:  2nd degree perineal lac Suture Repair: 2.0 3.0 vicryl Est. Blood Loss (mL):  450  Mom to postpartum.  Baby to Couplet care / Skin to Skin.  MRowland Lathe2/02/2023, 5:11 AM

## 2022-07-01 LAB — OB RESULTS CONSOLE GBS: GBS: NEGATIVE

## 2022-07-11 DIAGNOSIS — Z113 Encounter for screening for infections with a predominantly sexual mode of transmission: Secondary | ICD-10-CM | POA: Diagnosis not present

## 2022-07-11 DIAGNOSIS — Z3685 Encounter for antenatal screening for Streptococcus B: Secondary | ICD-10-CM | POA: Diagnosis not present

## 2022-07-18 DIAGNOSIS — D6959 Other secondary thrombocytopenia: Secondary | ICD-10-CM | POA: Diagnosis not present

## 2022-07-21 ENCOUNTER — Other Ambulatory Visit: Payer: Self-pay

## 2022-07-21 ENCOUNTER — Inpatient Hospital Stay (HOSPITAL_COMMUNITY)
Admission: AD | Admit: 2022-07-21 | Discharge: 2022-07-22 | DRG: 806 | Disposition: A | Discharge status: Home / Self Care | Payer: Medicaid Other | Attending: Obstetrics and Gynecology | Admitting: Obstetrics and Gynecology

## 2022-07-21 ENCOUNTER — Encounter (HOSPITAL_COMMUNITY): Payer: Self-pay | Admitting: *Deleted

## 2022-07-21 DIAGNOSIS — O9912 Other diseases of the blood and blood-forming organs and certain disorders involving the immune mechanism complicating childbirth: Secondary | ICD-10-CM | POA: Diagnosis present

## 2022-07-21 DIAGNOSIS — D6959 Other secondary thrombocytopenia: Secondary | ICD-10-CM | POA: Diagnosis present

## 2022-07-21 DIAGNOSIS — O479 False labor, unspecified: Principal | ICD-10-CM | POA: Diagnosis present

## 2022-07-21 DIAGNOSIS — Z3A38 38 weeks gestation of pregnancy: Secondary | ICD-10-CM | POA: Diagnosis not present

## 2022-07-21 DIAGNOSIS — O26893 Other specified pregnancy related conditions, third trimester: Secondary | ICD-10-CM | POA: Diagnosis not present

## 2022-07-21 DIAGNOSIS — O9902 Anemia complicating childbirth: Secondary | ICD-10-CM | POA: Diagnosis not present

## 2022-07-21 HISTORY — DX: Other specified health status: Z78.9

## 2022-07-21 LAB — CBC
HCT: 36.3 % (ref 36.0–46.0)
Hemoglobin: 12.6 g/dL (ref 12.0–15.0)
MCH: 31.7 pg (ref 26.0–34.0)
MCHC: 34.7 g/dL (ref 30.0–36.0)
MCV: 91.4 fL (ref 80.0–100.0)
Platelets: 107 10*3/uL — ABNORMAL LOW (ref 150–400)
RBC: 3.97 MIL/uL (ref 3.87–5.11)
RDW: 13.2 % (ref 11.5–15.5)
WBC: 6.4 10*3/uL (ref 4.0–10.5)
nRBC: 0 % (ref 0.0–0.2)

## 2022-07-21 LAB — HIV ANTIBODY (ROUTINE TESTING W REFLEX): HIV Screen 4th Generation wRfx: NONREACTIVE

## 2022-07-21 LAB — TYPE AND SCREEN
ABO/RH(D): A POS
Antibody Screen: NEGATIVE

## 2022-07-21 LAB — RPR: RPR Ser Ql: NONREACTIVE

## 2022-07-21 MED ORDER — OXYTOCIN BOLUS FROM INFUSION
333.0000 mL | Freq: Once | INTRAVENOUS | Status: AC
  Administered 2022-07-21: 333 mL via INTRAVENOUS

## 2022-07-21 MED ORDER — ONDANSETRON HCL 4 MG/2ML IJ SOLN: 4.0000 mg | INTRAMUSCULAR | Status: DC | PRN

## 2022-07-21 MED ORDER — LACTATED RINGERS IV SOLN: 500.0000 mL | Freq: Once | INTRAVENOUS | Status: DC

## 2022-07-21 MED ORDER — DOCUSATE SODIUM 100 MG PO CAPS: 100.0000 mg | ORAL_CAPSULE | Freq: Two times a day (BID) | ORAL | Status: DC

## 2022-07-21 MED ORDER — EPHEDRINE 5 MG/ML INJ: 10.0000 mg | INTRAVENOUS | Status: DC | PRN

## 2022-07-21 MED ORDER — FENTANYL CITRATE (PF) 100 MCG/2ML IJ SOLN
50.0000 ug | INTRAMUSCULAR | Status: DC | PRN
  Administered 2022-07-21: 100 ug via INTRAVENOUS
  Filled 2022-07-21: qty 2

## 2022-07-21 MED ORDER — LIDOCAINE HCL (PF) 1 % IJ SOLN
30.0000 mL | INTRAMUSCULAR | Status: AC | PRN
  Administered 2022-07-21: 30 mL via SUBCUTANEOUS
  Filled 2022-07-21: qty 30

## 2022-07-21 MED ORDER — SIMETHICONE 80 MG PO CHEW: 80.0000 mg | CHEWABLE_TABLET | ORAL | Status: DC | PRN

## 2022-07-21 MED ORDER — OXYCODONE-ACETAMINOPHEN 5-325 MG PO TABS: 1.0000 | ORAL_TABLET | ORAL | Status: DC | PRN

## 2022-07-21 MED ORDER — LACTATED RINGERS IV SOLN: 500.0000 mL | INTRAVENOUS | Status: DC | PRN

## 2022-07-21 MED ORDER — TETANUS-DIPHTH-ACELL PERTUSSIS 5-2.5-18.5 LF-MCG/0.5 IM SUSY: 0.5000 mL | PREFILLED_SYRINGE | Freq: Once | INTRAMUSCULAR | Status: DC

## 2022-07-21 MED ORDER — OXYCODONE-ACETAMINOPHEN 5-325 MG PO TABS: 2.0000 | ORAL_TABLET | ORAL | Status: DC | PRN

## 2022-07-21 MED ORDER — COCONUT OIL OIL: 1.0000 | TOPICAL_OIL | Status: DC | PRN

## 2022-07-21 MED ORDER — SOD CITRATE-CITRIC ACID 500-334 MG/5ML PO SOLN: 30.0000 mL | ORAL | Status: DC | PRN

## 2022-07-21 MED ORDER — TRANEXAMIC ACID-NACL 1000-0.7 MG/100ML-% IV SOLN
INTRAVENOUS | Status: AC
  Administered 2022-07-21: 1000 mg
  Filled 2022-07-21: qty 100

## 2022-07-21 MED ORDER — BENZOCAINE-MENTHOL 20-0.5 % EX AERO: 1.0000 | INHALATION_SPRAY | CUTANEOUS | Status: DC | PRN

## 2022-07-21 MED ORDER — OXYTOCIN-SODIUM CHLORIDE 30-0.9 UT/500ML-% IV SOLN
2.5000 [IU]/h | INTRAVENOUS | Status: DC
  Administered 2022-07-21: 2.5 [IU]/h via INTRAVENOUS
  Filled 2022-07-21: qty 500

## 2022-07-21 MED ORDER — BISACODYL 10 MG RE SUPP: 10.0000 mg | Freq: Every day | RECTAL | Status: DC | PRN

## 2022-07-21 MED ORDER — PRENATAL MULTIVITAMIN CH
1.0000 | ORAL_TABLET | Freq: Every day | ORAL | Status: DC
  Administered 2022-07-21: 1 via ORAL
  Filled 2022-07-21: qty 1

## 2022-07-21 MED ORDER — PHENYLEPHRINE 80 MCG/ML (10ML) SYRINGE FOR IV PUSH (FOR BLOOD PRESSURE SUPPORT): 80.0000 ug | PREFILLED_SYRINGE | INTRAVENOUS | Status: DC | PRN

## 2022-07-21 MED ORDER — TRANEXAMIC ACID-NACL 1000-0.7 MG/100ML-% IV SOLN: 1000.0000 mg | INTRAVENOUS | Status: DC

## 2022-07-21 MED ORDER — OXYCODONE HCL 5 MG PO TABS: 10.0000 mg | ORAL_TABLET | ORAL | Status: DC | PRN

## 2022-07-21 MED ORDER — DIPHENHYDRAMINE HCL 50 MG/ML IJ SOLN: 12.5000 mg | INTRAMUSCULAR | Status: DC | PRN

## 2022-07-21 MED ORDER — DIPHENHYDRAMINE HCL 25 MG PO CAPS: 25.0000 mg | ORAL_CAPSULE | Freq: Four times a day (QID) | ORAL | Status: DC | PRN

## 2022-07-21 MED ORDER — WITCH HAZEL-GLYCERIN EX PADS: 1.0000 | MEDICATED_PAD | CUTANEOUS | Status: DC | PRN

## 2022-07-21 MED ORDER — FLEET ENEMA 7-19 GM/118ML RE ENEM: 1.0000 | ENEMA | Freq: Every day | RECTAL | Status: DC | PRN

## 2022-07-21 MED ORDER — FENTANYL-BUPIVACAINE-NACL 0.5-0.125-0.9 MG/250ML-% EP SOLN
12.0000 mL/h | EPIDURAL | Status: DC | PRN
  Filled 2022-07-21: qty 250

## 2022-07-21 MED ORDER — ONDANSETRON HCL 4 MG PO TABS: 4.0000 mg | ORAL_TABLET | ORAL | Status: DC | PRN

## 2022-07-21 MED ORDER — ACETAMINOPHEN 325 MG PO TABS: 650.0000 mg | ORAL_TABLET | ORAL | Status: DC | PRN

## 2022-07-21 MED ORDER — OXYCODONE HCL 5 MG PO TABS: 5.0000 mg | ORAL_TABLET | ORAL | Status: DC | PRN

## 2022-07-21 MED ORDER — IBUPROFEN 600 MG PO TABS
600.0000 mg | ORAL_TABLET | Freq: Four times a day (QID) | ORAL | Status: DC
  Administered 2022-07-21 – 2022-07-22 (×4): 600 mg via ORAL
  Filled 2022-07-21 (×4): qty 1

## 2022-07-21 MED ORDER — LACTATED RINGERS IV SOLN: INTRAVENOUS | Status: DC

## 2022-07-21 MED ORDER — ONDANSETRON HCL 4 MG/2ML IJ SOLN: 4.0000 mg | Freq: Four times a day (QID) | INTRAMUSCULAR | Status: DC | PRN

## 2022-07-21 MED ORDER — DIBUCAINE (PERIANAL) 1 % EX OINT: 1.0000 | TOPICAL_OINTMENT | CUTANEOUS | Status: DC | PRN

## 2022-07-21 MED ORDER — ACETAMINOPHEN 325 MG PO TABS
650.0000 mg | ORAL_TABLET | ORAL | Status: DC | PRN
  Administered 2022-07-21: 650 mg via ORAL
  Filled 2022-07-21: qty 2

## 2022-07-21 NOTE — Progress Notes (Signed)
DOD Note Sittig up in bed, feeling less weak. Just ordered breakfast, baby boy in nursery under warmer for low temp. Is otherwise well. Reviewed low BP earlier, encouraged PO intake and hydration. BP 116/69   Pulse 98   Temp 97.8 F (36.6 C) (Oral)   Resp 16   Ht 5' 7"$  (1.702 m)   Wt 71.7 kg   SpO2 100%   Breastfeeding Unknown   BMI 24.75 kg/m  Benign exam this AM, firm fundus, routine pp

## 2022-07-21 NOTE — MAU Note (Signed)
.  Ashley Christian is a 22 y.o. at [redacted]w[redacted]d here in MAU reporting ctxs since 0015. Denies LOF or VB. Reports good FM 2cm last sve this wk  Onset of complaint: 0015 Pain score: 5 Vitals:   07/21/22 0108  Pulse: 64  Resp: 20  Temp: 97.8 F (36.6 C)  SpO2: 100%     FHT:136 Lab orders placed from triage:  mau labor eval

## 2022-07-21 NOTE — H&P (Signed)
Ashley Christian is a 22 y.o. female G1P0 29w4dpresenting for contractions since 0015. She reports no LOF, VB. Normal FM.   In MAU, exam 6.5/90/-2  Pregnancy c/b: Late entry into prenatal care at 20 weeks Anemia: on PO iron, admission Hgb 12.6 Gestational thrombocytopenia: admission platelets 107k H/o STI: gonorrhea/chlamydia in June 2023, treated, follow up testing negative  OB History     Gravida  1   Para      Term      Preterm      AB      Living         SAB      IAB      Ectopic      Multiple      Live Births             Past Medical History:  Diagnosis Date   Medical history non-contributory    Past Surgical History:  Procedure Laterality Date   NO PAST SURGERIES     Family History: family history includes Alopecia in her mother; Breast cancer in her maternal aunt; Cancer in her mother; Dementia in her maternal grandmother; GBerenice Primas disease in her mother; Hypertension in her maternal aunt, maternal grandmother, maternal uncle, and mother; Hyperthyroidism in her mother; Thyroid disease in her maternal aunt. Social History:  reports that she has never smoked. She has never used smokeless tobacco. She reports that she does not drink alcohol and does not use drugs.     Maternal Diabetes: No Genetic Screening: Normal Maternal Ultrasounds/Referrals: Normal Fetal Ultrasounds or other Referrals:  None Maternal Substance Abuse:  No Significant Maternal Medications:  None Significant Maternal Lab Results:  Group B Strep negative Other Comments:  None  Review of Systems Per HPI Exam Physical Exam  Dilation: 6.5 Effacement (%): 90 Station: -2 Exam by:: [employer] RN Blood pressure 99/84, pulse 63, temperature 97.8 F (36.6 C), temperature source Oral, resp. rate 16, height 5' 7"$  (1.702 m), weight 71.7 kg, SpO2 100 %. Gen: NAD, breathing through contractions CVS: normal pulses Lungs: Nonlabored respirations Abd: Gravid abdome, [place]  7lb Ext; no calf edema or tenderness  Fetal testing: 130bpm, mod variability, + accels, no decels Toco: Ctx q 2-3 min Prenatal labs: ABO, Rh:  --/--/A POS (02/09 0152) Antibody: NEG (02/09 0152) Rubella: Immune (10/02 0000) RPR: Nonreactive (10/02 0000)  HBsAg: Negative (10/02 0000)  HIV: Non-reactive (10/02 0000)  GBS: Negative/-- (01/20 0000)   Assessment/Plan: 21Y G1P0 @ 3107w4dactive labor Fetal wellbeing: cat I tracing Active labor: expectant management. Offered AROM as augmentation, patient declines at this time. Anticipate SVD Pain control: received IV fentanyl. Undecided about epidural at this time. Coping well.    MiRowland Lathe/02/2023, 3:16 AM

## 2022-07-21 NOTE — Lactation Note (Signed)
This note was copied from a baby's chart. Lactation Consultation Note  Patient Name: Ashley Christian [id number] Date: 07/21/2022 Reason for consult: Initial assessment;1st time breastfeeding;Early term 37-38.6wks Age:21 hours  P1, Unwrapped baby for feeding.  Mother can hand express good flow.  Demonstrated how to spoon feed drops. Assisted with latching.  Demonstrated how to flange bottom lip.  Baby sustained latch for 15 min on R breast. Mother requested DEBP set up.  Mother became sleepy during pump set up.  Mother may need help with positioning and pump review.   Feed on demand with cues.  Goal 8-12+ times per day after first 24 hrs.  Place baby STS if not cueing.  Wake baby for feedings if needed.   Maternal Data Has patient been taught Hand Expression?: Yes Does the patient have breastfeeding experience prior to this delivery?: No  Feeding Mother's Current Feeding Choice: Breast Milk and Formula  LATCH Score Latch: Grasps breast easily, tongue down, lips flanged, rhythmical sucking.  Audible Swallowing: A few with stimulation  Type of Nipple: Everted at rest and after stimulation  Comfort (Breast/Nipple): Soft / non-tender  Hold (Positioning): Assistance needed to correctly position infant at breast and maintain latch.  LATCH Score: 8   Lactation Tools Discussed/Used Tools: Pump;Flanges Flange Size: 21 Breast pump type: Double-Electric Breast Pump;Manual Pump Education: Setup, frequency, and cleaning;Milk Storage Reason for Pumping: mother's request Pumping frequency: PRN  Interventions Interventions: Breast feeding basics reviewed;Assisted with latch;Skin to skin;Hand express;DEBP;Adjust position;Support pillows;Position options;Education;LC Services brochure  Discharge    Consult Status Consult Status: Follow-up Date: 07/22/22 Follow-up type: In-patient    Vivianne Master [facility] 07/21/2022, 11:43 AM

## 2022-07-22 LAB — CBC
HCT: 28.2 % — ABNORMAL LOW (ref 36.0–46.0)
Hemoglobin: 9.8 g/dL — ABNORMAL LOW (ref 12.0–15.0)
MCH: 31.5 pg (ref 26.0–34.0)
MCHC: 34.8 g/dL (ref 30.0–36.0)
MCV: 90.7 fL (ref 80.0–100.0)
Platelets: 109 10*3/uL — ABNORMAL LOW (ref 150–400)
RBC: 3.11 MIL/uL — ABNORMAL LOW (ref 3.87–5.11)
RDW: 13.2 % (ref 11.5–15.5)
WBC: 9.4 10*3/uL (ref 4.0–10.5)
nRBC: 0 % (ref 0.0–0.2)

## 2022-07-22 MED ORDER — IBUPROFEN 600 MG PO TABS: 600.0000 mg | ORAL_TABLET | Freq: Four times a day (QID) | ORAL | 0 refills | Status: AC

## 2022-07-22 NOTE — Lactation Note (Signed)
This note was copied from a baby's chart. Lactation Consultation Note  Patient Name: Ashley Christian [id number] Date: 07/22/2022 Age : 21 hours old  Reason for consult: Follow-up assessment;Early term 37-38.6wks;Primapara;1st time breastfeeding;Infant weight loss (3 % weight loss, dyad ready for D/C) Per mom just waiting for a car seat.  LC reviewed BF D/C teaching, importance of STS until the baby is back to  birth weight, gaining well , can stay awake for majority of the feeding.  Feed with feeding cues and by 3 hours offer the breast. ( 8-12 times in 24 hours). LC discussed nutritive vs non-nutritive feeding patterns and to watch the baby for hanging out latched.  Per mom has a DEBP for home and LC reviewed the hand pump with D/C teaching.   Feeding Mother's Current Feeding Choice: Breast Milk  LATCH Score  Lactation Tools Discussed/Used Tools: Pump;Flanges Flange Size: 21 Breast pump type: Double-Electric Breast Pump Pump Education: Milk Storage  Interventions Interventions: Breast feeding basics reviewed;Education  Discharge Discharge Education: Engorgement and breast care;Warning signs for feeding baby Pump: Personal;Manual  Consult Status Consult Status: Complete Date: 07/22/22    Myer Haff 07/22/2022, 12:16 PM

## 2022-07-22 NOTE — Discharge Instructions (Signed)
As per discharge pamphlet °

## 2022-07-22 NOTE — Progress Notes (Signed)
PPD #1 No problems, she wants to go home today Afeb, VSS Fundus firm, NT at U-1 Continue routine postpartum care, d/c home today unless problems arise

## 2022-07-22 NOTE — Discharge Summary (Signed)
Postpartum Discharge Summary      Patient Name: Ashley Christian DOB: 2000/11/23 MRN: [id number]  Date of admission: 07/21/2022 Delivery date:07/21/2022  Delivering provider: Irene Pap E  Date of discharge: 07/22/2022  Admitting diagnosis: Uterine contractions [O47.9] Intrauterine pregnancy: [redacted]w[redacted]d    Secondary diagnosis:  Principal Problem:   Uterine contractions    [facility] With [facility]aginal Delivery      22y.o. yo G1P1001 at 36w4das admitted in Active Labor on 07/21/2022. Labor course was complicated by nothing  Membrane Rupture Time/Date: 3:10 AM ,07/21/2022   Delivery Method:Vaginal, Spontaneous  Episiotomy: None  Lacerations:  2nd degree;Perineal  Patient had a postpartum course complicated by  nothing.  She is ambulating, tolerating a regular diet, passing flatus, and urinating well. Patient is discharged home in stable condition on 07/22/22.  Newborn Data: Birth date:07/21/2022  Birth time:4:03 AM  Gender:Female  Living status:Living  Apgars:9 ,9  Weight:2610 g   Physical exam  Vitals:   07/21/22 1257 07/21/22 1700 07/21/22 2107 07/22/22 0522  BP: (!) 100/55 120/73 108/64 107/67  Pulse: 68  62 60  Resp: 18 16 18 19  $ Temp: 98 F (36.7 C) 98.4 F (36.9 C) 98 F (36.7 C) 98 F (36.7 C)  TempSrc: Oral Oral Oral Oral  SpO2: 100% 99% 98% 100%  Weight:      Height:       General: alert Lochia: appropriate Uterine Fundus: firm  Labs: Lab Results  Component Value Date   WBC 9.4 07/22/2022   HGB 9.8 (L) 07/22/2022   HCT 28.2 (L) 07/22/2022   MCV 90.7 07/22/2022   PLT 109 (L) 07/22/2022      Latest Ref Rng & Units 05/29/2021    7:53 PM  CMP  Glucose 70 - 99 mg/dL 90   BUN 6 - 20 mg/dL 18   Creatinine 0.44 - 1.00 mg/dL 0.85   Sodium 135 - 145 mmol/L 133   Potassium 3.5 - 5.1 mmol/L 3.6   Chloride 98 - 111 mmol/L 96   CO2 22 - 32 mmol/L  25   Calcium 8.9 - 10.3 mg/dL 9.4    Edinburgh Score:     No data to display            After visit meds:  Allergies as of 07/22/2022   No Known Allergies      Medication List     STOP taking these medications    HYDROcodone-acetaminophen 7.5-325 mg/15 ml solution Commonly known as: HYCET   medroxyPROGESTERone 150 MG/ML injection Commonly known as: DEPO-PROVERA       TAKE these medications    ferrous sulfate 325 (65 FE) MG tablet Take 325 mg by mouth daily with breakfast.   ibuprofen 600 MG tablet Commonly known as: ADVIL Take 1 tablet (600 mg total) by mouth every 6 (six) hours.   levocetirizine 5 MG tablet Commonly known as: XYZAL Take 1 tablet (5 mg total) by mouth daily.   multivitamin-prenatal 27-0.8 MG Tabs tablet Take 1 tablet by mouth daily at 12 noon.   omeprazole 20 MG capsule Commonly known as: PRILOSEC Take 1 capsule (20 mg total) by mouth daily.  Discharge home in stable condition Infant Feeding: Breast Infant Disposition:home with mother Discharge instruction: per After Visit Summary and Postpartum booklet. Activity: Advance as tolerated. Pelvic rest for 6 weeks.  Diet: routine diet Postpartum Appointment:6 weeks Follow up Visit:  Follow-up Information     Shivaji, Melida Quitter, MD. Schedule an appointment as soon as possible for a visit in 6 week(s).   Specialty: Obstetrics and Gynecology Contact information: [address] [place] [address] [phone]                     07/22/2022 Clarene Duke, MD

## 2022-07-22 NOTE — Plan of Care (Signed)

## 2022-07-26 ENCOUNTER — Inpatient Hospital Stay (HOSPITAL_COMMUNITY)
Admission: RE | Admit: 2022-07-26 | Payer: Medicaid Other | Source: Home / Self Care | Admitting: Obstetrics and Gynecology

## 2022-07-26 ENCOUNTER — Telehealth: Payer: Self-pay | Admitting: Family Medicine

## 2022-07-26 NOTE — Telephone Encounter (Signed)
Copied from [place]. Topic: General - Other >> Jul 26, 2022 11:38 AM [place] B wrote: Reason for CRM: Patients mother called in asking for an emotional support letter for her dog. The previous one she had expired. She also wants to know if patient needs to come in for an appt

## 2022-08-02 ENCOUNTER — Telehealth (HOSPITAL_COMMUNITY): Payer: Self-pay | Admitting: *Deleted

## 2022-08-02 NOTE — Telephone Encounter (Signed)
Left phone voicemail message.  Odis Hollingshead, RN 08-02-2022 at 3:11pm

## 2022-08-21 ENCOUNTER — Other Ambulatory Visit: Payer: Self-pay | Admitting: Family Medicine

## 2022-08-21 ENCOUNTER — Telehealth: Payer: Medicaid Other | Admitting: Family Medicine

## 2022-08-22 ENCOUNTER — Telehealth: Payer: Self-pay | Admitting: *Deleted

## 2022-08-22 NOTE — Telephone Encounter (Signed)
Provider called patient back and left a  msg on her VM.     Copied from [place] [phone]. Topic: Appointment Scheduling - Scheduling Inquiry for Clinic >> Aug 21, 2022  4:05 PM [place] B wrote: Reason for CRM: Patients mother and pt tried to call in  says line was dropped and wasn't able to get back on call. Please call back

## 2022-08-25 NOTE — Telephone Encounter (Signed)
I have attempted without success to contact this patient by phone to I left a message on answering machine.

## 2022-08-25 NOTE — Telephone Encounter (Signed)
Pt is returning call. Stated Dr.Wilson left her a message regarding her upcoming appointment for an emotional support animal/letter and asked her to call back and speak with her nurse, and her nurse would give her a message.  Please advise.

## 2022-08-25 NOTE — Progress Notes (Unsigned)
Patient ID: Ashley Christian, female    DOB: July 20, 2000  MRN: [id number]  CC: Emotional Support Letter   Subjective: Ashley Christian is a 22 y.o. female who presents for emotional support letter.   Her concerns today include:    Patient Active Problem List   Diagnosis Date Noted   Uterine contractions 07/21/2022     Current Outpatient Medications on File Prior to Visit  Medication Sig Dispense Refill   ferrous sulfate 325 (65 FE) MG tablet Take 325 mg by mouth daily with breakfast.     ibuprofen (ADVIL) 600 MG tablet Take 1 tablet (600 mg total) by mouth every 6 (six) hours. 30 tablet 0   levocetirizine (XYZAL) 5 MG tablet Take 1 tablet (5 mg total) by mouth daily. 30 tablet 3   omeprazole (PRILOSEC) 20 MG capsule Take 1 capsule (20 mg total) by mouth daily. 30 capsule 0   Prenatal Vit-Fe Fumarate-FA (MULTIVITAMIN-PRENATAL) 27-0.8 MG TABS tablet Take 1 tablet by mouth daily at 12 noon.     No current facility-administered medications on file prior to visit.    No Known Allergies  Social History   Socioeconomic History   Marital status: Single    Spouse name: Not on file   Number of children: Not on file   Years of education: Not on file   Highest education level: Not on file  Occupational History   Not on file  Tobacco Use   Smoking status: Never   Smokeless tobacco: Never  Vaping Use   Vaping Use: Never used  Substance and Sexual Activity   Alcohol use: Never   Drug use: Never   Sexual activity: Yes  Other Topics Concern   Not on file  Social History Narrative   Not on file   Social Determinants of Health   Financial Resource Strain: Not on file  Food Insecurity: No Food Insecurity (07/21/2022)   Hunger Vital Sign    Worried About Running Out of Food in the Last Year: Never true    Ran Out of Food in the Last Year: Never true  Transportation Needs: No Transportation Needs (07/21/2022)   PRAPARE - Administrator, Civil Service (Medical): No     Lack of Transportation (Non-Medical): No  Physical Activity: Not on file  Stress: Not on file  Social Connections: Not on file  Intimate Partner Violence: Unknown (07/21/2022)   Humiliation, Afraid, Rape, and Kick questionnaire    Fear of Current or Ex-Partner: Not on file    Emotionally Abused: No    Physically Abused: No    Sexually Abused: No    Family History  Problem Relation Age of Onset   Cancer Mother    Luiz Blare' disease Mother    Alopecia Mother    Hyperthyroidism Mother    Hypertension Mother    Breast cancer Maternal Aunt    Hypertension Maternal Aunt    Thyroid disease Maternal Aunt    Hypertension Maternal Uncle    Hypertension Maternal Grandmother    Dementia Maternal Grandmother     Past Surgical History:  Procedure Laterality Date   NO PAST SURGERIES      ROS: Review of Systems Negative except as stated above  PHYSICAL EXAM: There were no vitals taken for this visit.  Physical Exam  {female adult master:310786} {female adult master:310785}     Latest Ref Rng & Units 05/29/2021    7:53 PM  CMP  Glucose 70 - 99 mg/dL 90  BUN 6 - 20 mg/dL 18   Creatinine 1.61 - 1.00 mg/dL 0.96   Sodium 045 - 409 mmol/L 133   Potassium 3.5 - 5.1 mmol/L 3.6   Chloride 98 - 111 mmol/L 96   CO2 22 - 32 mmol/L 25   Calcium 8.9 - 10.3 mg/dL 9.4    Lipid Panel  No results found for: "CHOL", "TRIG", "HDL", "CHOLHDL", "VLDL", "LDLCALC", "LDLDIRECT"  CBC    Component Value Date/Time   WBC 9.4 07/22/2022 0419   RBC 3.11 (L) 07/22/2022 0419   HGB 9.8 (L) 07/22/2022 0419   HCT 28.2 (L) 07/22/2022 0419   PLT 109 (L) 07/22/2022 0419   MCV 90.7 07/22/2022 0419   MCH 31.5 07/22/2022 0419   MCHC 34.8 07/22/2022 0419   RDW 13.2 07/22/2022 0419   LYMPHSABS 3.3 05/29/2021 1953   MONOABS 0.8 05/29/2021 1953   EOSABS 0.1 05/29/2021 1953   BASOSABS 0.1 05/29/2021 1953    ASSESSMENT AND PLAN:  There are no diagnoses linked to this encounter.   Patient was given the  opportunity to ask questions.  Patient verbalized understanding of the plan and was able to repeat key elements of the plan. Patient was given clear instructions to go to Emergency Department or return to medical center if symptoms don't improve, worsen, or new problems develop.The patient verbalized understanding.   No orders of the defined types were placed in this encounter.    Requested Prescriptions    No prescriptions requested or ordered in this encounter    No follow-ups on file.  Rema Fendt, NP

## 2022-08-29 ENCOUNTER — Ambulatory Visit (INDEPENDENT_AMBULATORY_CARE_PROVIDER_SITE_OTHER): Payer: Medicaid Other | Admitting: Family Medicine

## 2022-08-29 ENCOUNTER — Encounter: Payer: Self-pay | Admitting: Family Medicine

## 2022-08-29 VITALS — BP 103/60 | HR 73 | Temp 98.1°F | Resp 16 | Ht 67.0 in | Wt 131.0 lb

## 2022-08-29 DIAGNOSIS — F53 Postpartum depression: Secondary | ICD-10-CM | POA: Diagnosis not present

## 2022-08-29 DIAGNOSIS — L700 Acne vulgaris: Secondary | ICD-10-CM

## 2022-08-29 DIAGNOSIS — Z0289 Encounter for other administrative examinations: Secondary | ICD-10-CM

## 2022-08-29 NOTE — Progress Notes (Signed)
Patient is here to be reevaluated for emotional support animal. No other concerns

## 2022-08-30 NOTE — Progress Notes (Signed)
Established Patient Office Visit  Subjective    Patient ID: Ashley Christian, female    DOB: [birth year]  Age: 22 y.o. MRN: [id number]  CC: No chief complaint on file.   HPI Ashley Christian presents for an emotional support letter for her bully dog. She also recently had a baby who is 22 month old. Patient reports acne and some mood changes since having her baby.    Outpatient Encounter Medications as of 08/29/2022  Medication Sig   ferrous sulfate 325 (65 FE) MG tablet Take 325 mg by mouth daily with breakfast.   ibuprofen (ADVIL) 600 MG tablet Take 1 tablet (600 mg total) by mouth every 6 (six) hours.   levocetirizine (XYZAL) 5 MG tablet Take 1 tablet (5 mg total) by mouth daily.   omeprazole (PRILOSEC) 20 MG capsule Take 1 capsule (20 mg total) by mouth daily.   Prenatal Vit-Fe Fumarate-FA (MULTIVITAMIN-PRENATAL) 27-0.8 MG TABS tablet Take 1 tablet by mouth daily at 12 noon.   No facility-administered encounter medications on file as of 08/29/2022.    Past Medical History:  Diagnosis Date   Medical history non-contributory     Past Surgical History:  Procedure Laterality Date   NO PAST SURGERIES      Family History  Problem Relation Age of Onset   Cancer Mother    [name]' disease Mother    Alopecia Mother    Hyperthyroidism Mother    Hypertension Mother    Breast cancer Maternal Aunt    Hypertension Maternal Aunt    Thyroid disease Maternal Aunt    Hypertension Maternal Uncle    Hypertension Maternal Grandmother    Dementia Maternal Grandmother     Social History   Socioeconomic History   Marital status: Single    Spouse name: Not on file   Number of children: Not on file   Years of education: Not on file   Highest education level: Not on file  Occupational History   Not on file  Tobacco Use   Smoking status: Never   Smokeless tobacco: Never  Vaping Use   Vaping Use: Never used  Substance and Sexual Activity   Alcohol use: Never   Drug use: Never    Sexual activity: Yes  Other Topics Concern   Not on file  Social History Narrative   Not on file   Social Determinants of Health   Financial Resource Strain: Not on file  Food Insecurity: No Food Insecurity (07/21/2022)   Hunger Vital Sign    Worried About Running Out of Food in the Last Year: Never true    Ran Out of Food in the Last Year: Never true  Transportation Needs: No Transportation Needs (07/21/2022)   PRAPARE - Hydrologist (Medical): No    Lack of Transportation (Non-Medical): No  Physical Activity: Not on file  Stress: Not on file  Social Connections: Not on file  Intimate Partner Violence: Unknown (07/21/2022)   Humiliation, Afraid, Rape, and Kick questionnaire    Fear of Current or Ex-Partner: Not on file    Emotionally Abused: No    Physically Abused: No    Sexually Abused: No    Review of Systems  Psychiatric/Behavioral:  Positive for depression. Negative for suicidal ideas.   All other systems reviewed and are negative.       Objective    BP 103/60   Pulse 73   Temp 98.1 F (36.7 C) (Oral)   Resp 16  Ht 5\' 7"  (1.702 m)   Wt 131 lb (59.4 kg)   SpO2 97%   Breastfeeding Yes   BMI 20.52 kg/m   Physical Exam Vitals and nursing note reviewed.  Constitutional:      General: She is not in acute distress. Cardiovascular:     Rate and Rhythm: Normal rate and regular rhythm.  Pulmonary:     Effort: Pulmonary effort is normal.     Breath sounds: Normal breath sounds.  Abdominal:     Palpations: Abdomen is soft.     Tenderness: There is no abdominal tenderness.  Skin:    Findings: Acne present.  Neurological:     General: No focal deficit present.     Mental Status: She is alert and oriented to person, place, and time.  Psychiatric:        Mood and Affect: Affect normal.        Behavior: Behavior normal.         Assessment & Plan:   1. Acne vulgaris Discussed skin care. Will monitor  2. Post partum  depression Patient defers any agent at this time.   3. Encounter for completion of form with patient After a long discussion, patient defers getting letter for emotional support animal since she is now with a newborn infant to care for.    No follow-ups on file.   Becky Sax, MD

## 2022-08-31 DIAGNOSIS — M6208 Separation of muscle (nontraumatic), other site: Secondary | ICD-10-CM | POA: Diagnosis not present

## 2022-08-31 DIAGNOSIS — Z30013 Encounter for initial prescription of injectable contraceptive: Secondary | ICD-10-CM | POA: Diagnosis not present

## 2022-08-31 DIAGNOSIS — K5900 Constipation, unspecified: Secondary | ICD-10-CM | POA: Diagnosis not present

## 2022-08-31 DIAGNOSIS — Z1331 Encounter for screening for depression: Secondary | ICD-10-CM | POA: Diagnosis not present

## 2022-08-31 DIAGNOSIS — Z3042 Encounter for surveillance of injectable contraceptive: Secondary | ICD-10-CM | POA: Diagnosis not present

## 2022-09-06 ENCOUNTER — Encounter: Payer: Self-pay | Admitting: Family Medicine

## 2023-07-27 IMAGING — CT CT NECK W/ CM
3 of 4 series · 11 of 33 positions shown, 13 images · IV contrast (omnipaque)
Comparison: None.

CLINICAL DATA: Sore throat since [REDACTED]

EXAM:
CT NECK WITH CONTRAST
TECHNIQUE: Multidetector CT imaging of the neck was performed using the
standard protocol following the bolus administration of intravenous
contrast.
CONTRAST:  60mL OMNIPAQUE IOHEXOL 350 MG/ML SOLN

[Series 5: axial · axial · 0.33mm/px · z∈[-287,-115]mm · 3 of 161 slices shown, 4 images]
[im 46/161  soft-tissue]
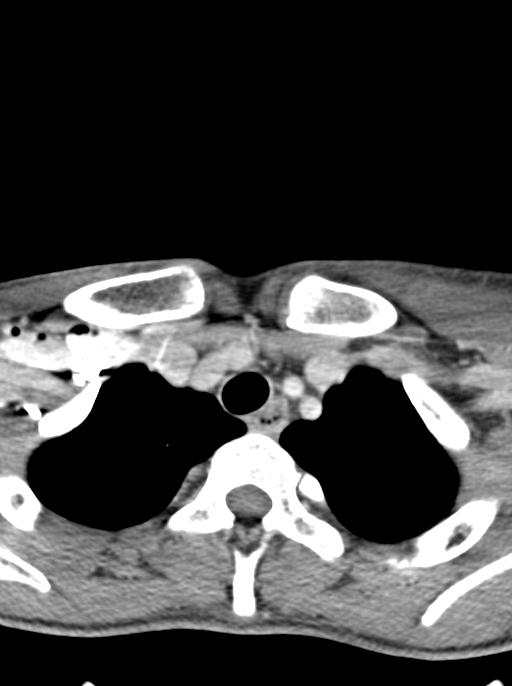
[im 46/161  bone]
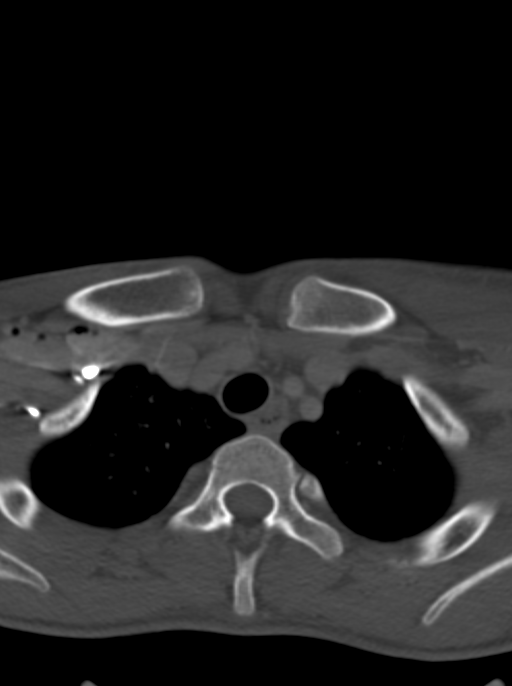
[im 92/161  bone]
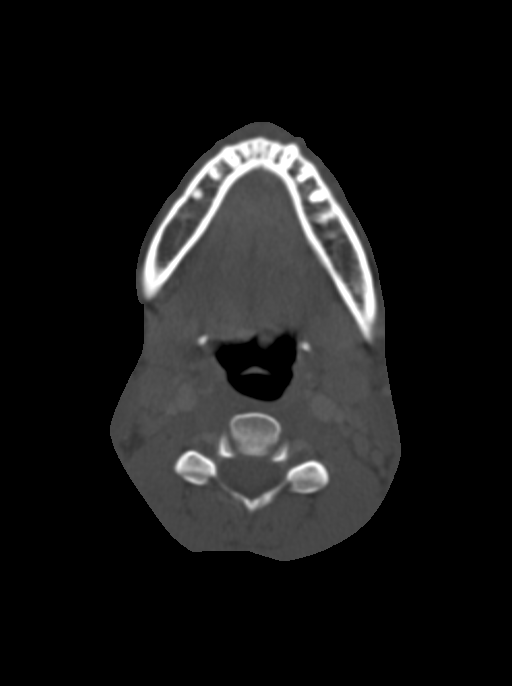
[im 138/161  bone]
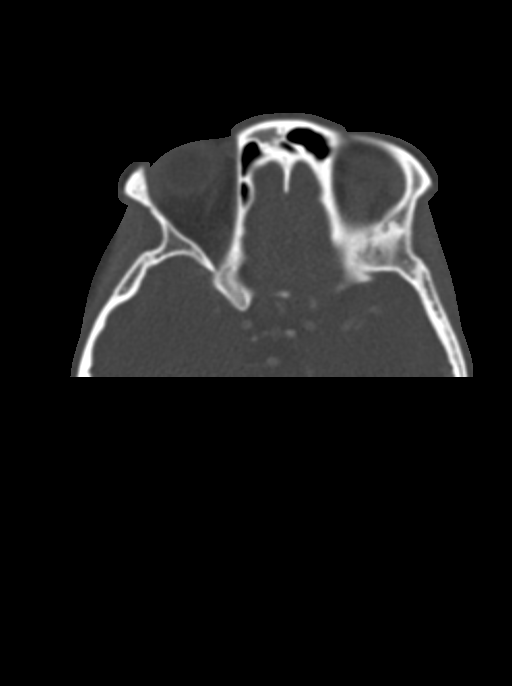

[Series 6: coronal · coronal · 0.33mm/px · 3 of 115 slices shown]
[im 37/115  bone]
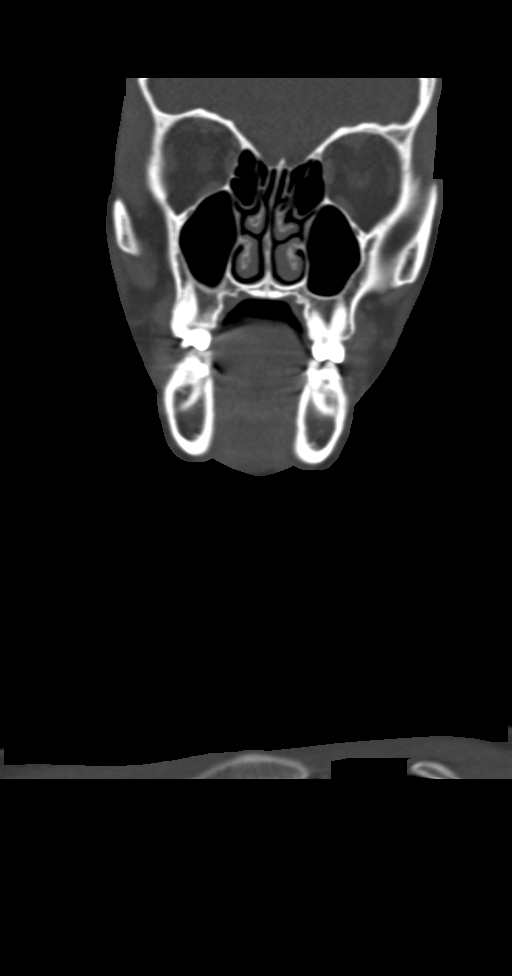
[im 51/115  bone]
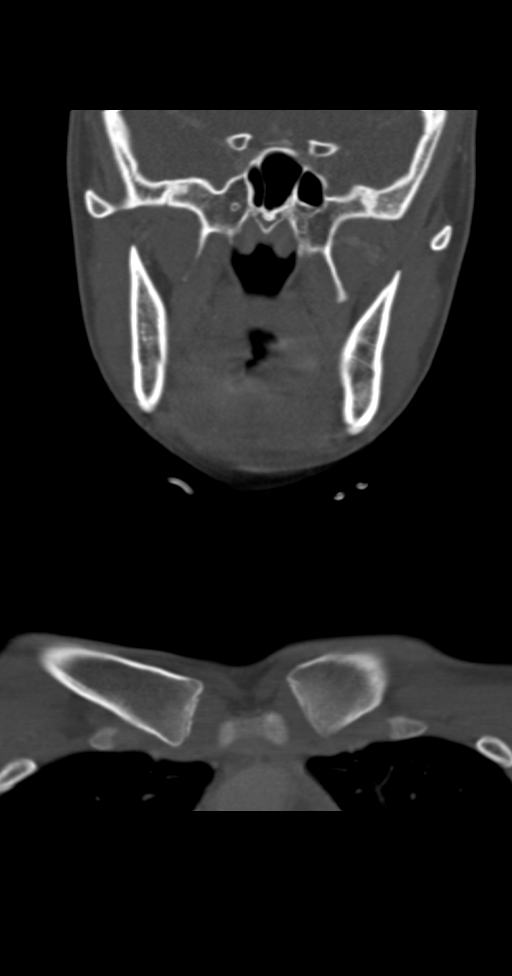
[im 64/115  bone]
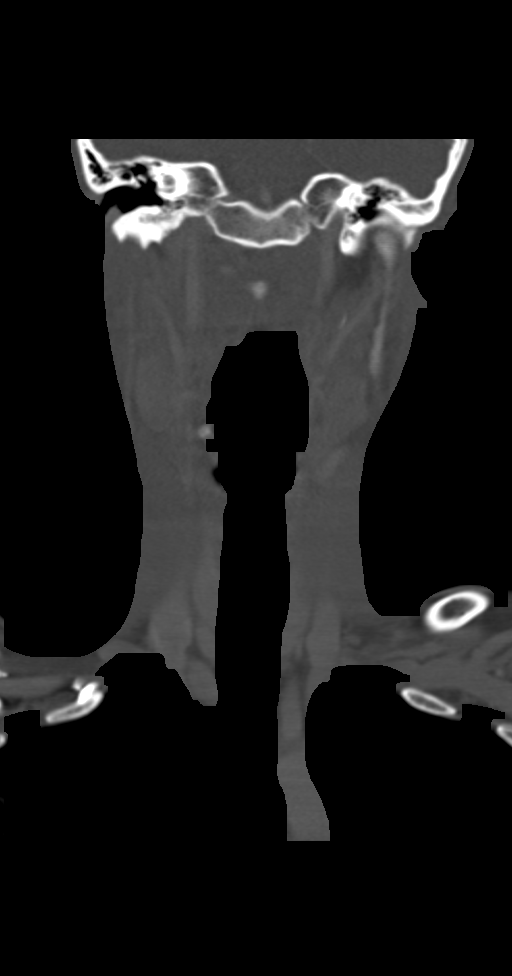

[Series 7: sagittal · sagittal · 0.45mm/px · 5 of 86 slices shown, 6 images]
[im 29/86  bone]
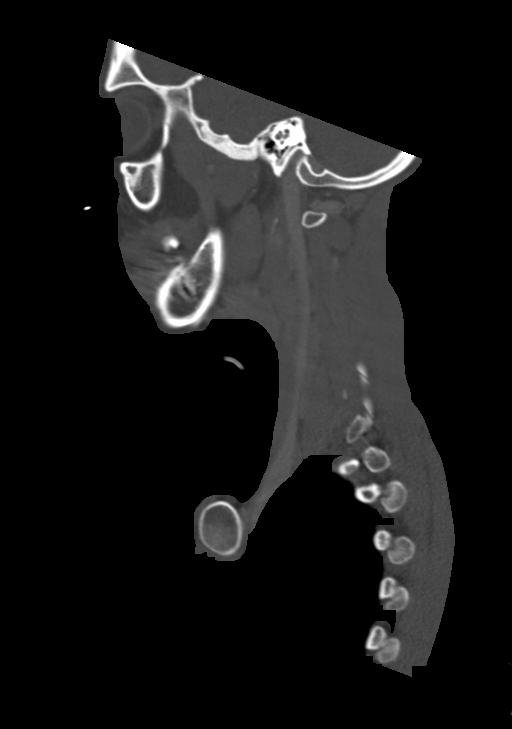
[im 36/86  bone]
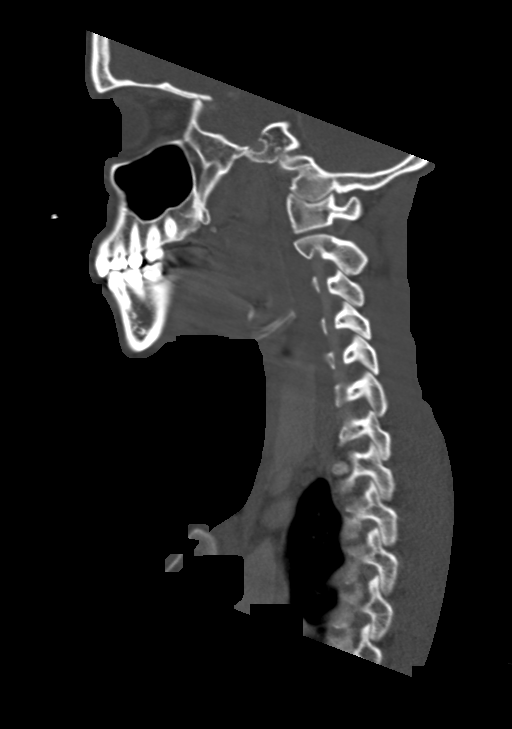
[im 43/86  soft-tissue]
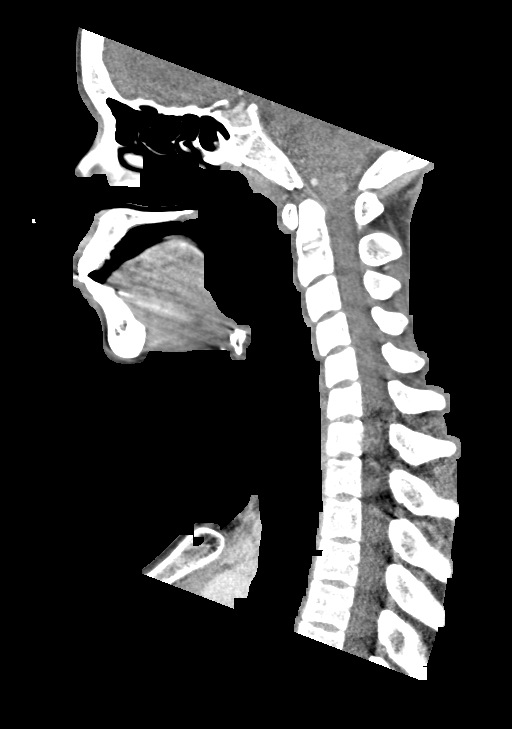
[im 43/86  bone]
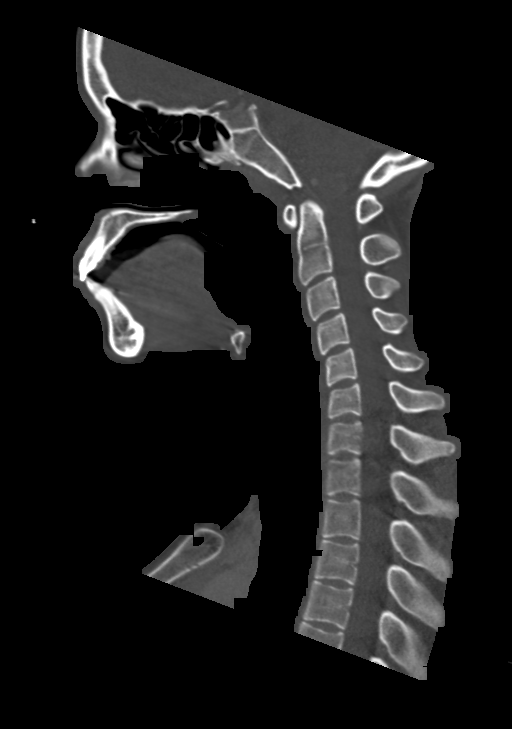
[im 50/86  bone]
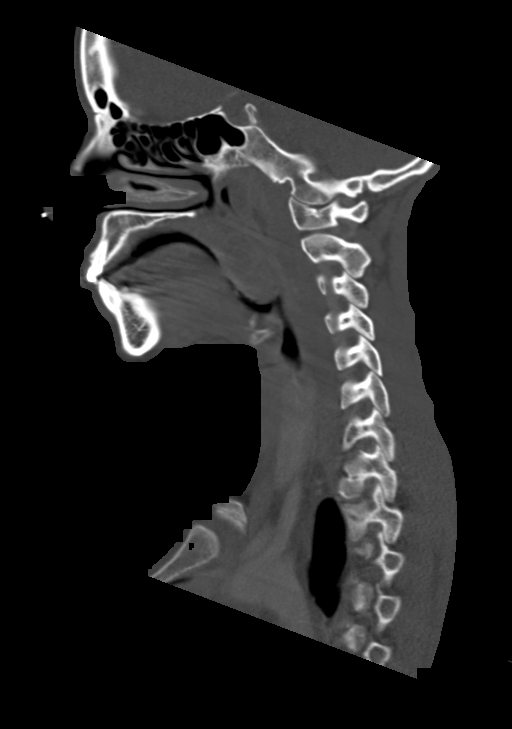
[im 57/86  bone]
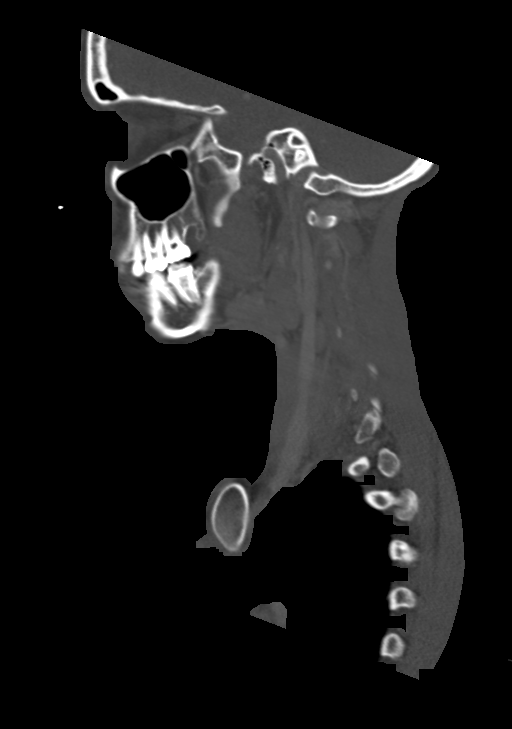

[11 of 33 positions shown; findings below may reference images not displayed]

FINDINGS: Pharynx and larynx: Enlargement and heterogeneous enhancement of the
bilateral palatine tonsils, which enhance heterogeneously, and meet
in the midline ("kissing tonsils"), concerning for tonsillitis. No
definite low-density collection is seen to suggest peritonsillar
abscess. The larynx and pharynx are otherwise unremarkable.

Salivary glands: No inflammation, mass, or stone.

Thyroid: Normal.

Lymph nodes: Prominent bilateral level 2A lymph nodes, the largest
of which is on the right, measuring up to 1.3 cm. This is favored to
be reactive.

Vascular: Patent arterial and venous vasculature.

Limited intracranial: Negative.

Visualized orbits: Negative.

Mastoids and visualized paranasal sinuses: Clear.

Skeleton: No acute osseous abnormality.

Upper chest: Negative.

Other: None
IMPRESSION: Enlargement and heterogeneous enhancement of the bilateral palatine
tonsils, concerning for tonsillitis. No definite low-density
collection to suggest peritonsillar abscess.

## 2024-01-29 ENCOUNTER — Ambulatory Visit

## 2024-02-01 ENCOUNTER — Ambulatory Visit (INDEPENDENT_AMBULATORY_CARE_PROVIDER_SITE_OTHER): Admitting: *Deleted

## 2024-02-01 DIAGNOSIS — Z111 Encounter for screening for respiratory tuberculosis: Secondary | ICD-10-CM

## 2024-02-01 NOTE — Progress Notes (Signed)
 Patient is in office today for a nurse visit for PPD. Patient Injection was given in the  Right arm. Patient tolerated injection well.

## 2024-02-04 ENCOUNTER — Ambulatory Visit: Payer: Self-pay | Admitting: *Deleted

## 2024-02-04 LAB — TB SKIN TEST
Induration: 0 mm
TB Skin Test: NEGATIVE

## 2024-02-04 NOTE — Progress Notes (Signed)
 Results note for patient
